# Patient Record
Sex: Female | Born: 1990 | Race: White | Hispanic: No | Marital: Single | Smoking: Never smoker
Health system: Southern US, Community
[De-identification: ages and names within clinical notes are randomized; demographics above are authoritative.]

## PROBLEM LIST (undated history)

## (undated) ENCOUNTER — Emergency Department (HOSPITAL_COMMUNITY): Admission: EM | Payer: Self-pay | Source: Home / Self Care

## (undated) DIAGNOSIS — E669 Obesity, unspecified: Secondary | ICD-10-CM

## (undated) DIAGNOSIS — E119 Type 2 diabetes mellitus without complications: Secondary | ICD-10-CM

## (undated) DIAGNOSIS — I10 Essential (primary) hypertension: Secondary | ICD-10-CM

## (undated) DIAGNOSIS — J302 Other seasonal allergic rhinitis: Secondary | ICD-10-CM

## (undated) DIAGNOSIS — E7800 Pure hypercholesterolemia, unspecified: Secondary | ICD-10-CM

## (undated) DIAGNOSIS — E079 Disorder of thyroid, unspecified: Secondary | ICD-10-CM

## (undated) HISTORY — PX: TONSILLECTOMY: SUR1361

---
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
## 2012-03-14 ENCOUNTER — Emergency Department (HOSPITAL_COMMUNITY)
Admission: EM | Admit: 2012-03-14 | Discharge: 2012-03-14 | Disposition: A | Discharge status: Home / Self Care | Payer: Self-pay | Attending: Emergency Medicine | Admitting: Emergency Medicine

## 2012-03-14 ENCOUNTER — Encounter (HOSPITAL_COMMUNITY): Payer: Self-pay

## 2012-03-14 DIAGNOSIS — I10 Essential (primary) hypertension: Secondary | ICD-10-CM | POA: Insufficient documentation

## 2012-03-14 DIAGNOSIS — E119 Type 2 diabetes mellitus without complications: Secondary | ICD-10-CM | POA: Insufficient documentation

## 2012-03-14 DIAGNOSIS — J3489 Other specified disorders of nose and nasal sinuses: Secondary | ICD-10-CM | POA: Insufficient documentation

## 2012-03-14 DIAGNOSIS — Z880 Allergy status to penicillin: Secondary | ICD-10-CM | POA: Insufficient documentation

## 2012-03-14 DIAGNOSIS — E669 Obesity, unspecified: Secondary | ICD-10-CM | POA: Insufficient documentation

## 2012-03-14 DIAGNOSIS — J349 Unspecified disorder of nose and nasal sinuses: Secondary | ICD-10-CM

## 2012-03-14 HISTORY — DX: Obesity, unspecified: E66.9

## 2012-03-14 HISTORY — DX: Other seasonal allergic rhinitis: J30.2

## 2012-03-14 HISTORY — DX: Essential (primary) hypertension: I10

## 2012-03-14 HISTORY — DX: Pure hypercholesterolemia, unspecified: E78.00

## 2012-03-14 HISTORY — DX: Type 2 diabetes mellitus without complications: E11.9

## 2012-03-14 HISTORY — DX: Disorder of thyroid, unspecified: E07.9

## 2012-03-14 NOTE — ED Provider Notes (Signed)
History     CSN: [id number]  Arrival date & time 03/14/12  1003   First MD Initiated Contact with Patient 03/14/12 1032      Chief Complaint  Patient presents with  . Foreign Body in Nose    (Consider location/radiation/quality/duration/timing/severity/associated sxs/prior treatment) HPI Comments: Pt has noted "something in my R nostril" ~ 2 weeks.  Denies pain but obstructing breathing thru nostril.no fever or chills.  No other complaints.  Patient is a 21 y.o. female presenting with foreign body in nose. The history is provided by the patient. No language interpreter was used.  Foreign Body in Nose This is a new problem. Episode onset: ~ 2 weeks ago. The problem occurs constantly. The problem has been unchanged. Pertinent negatives include no chills or fever. Nothing aggravates the symptoms. She has tried nothing for the symptoms.    Past Medical History  Diagnosis Date  . Hypertension   . Diabetes mellitus without complication   . Obesity   . Seasonal allergies   . Hypercholesteremia   . Thyroid disease     Past Surgical History  Procedure Date  . Tonsillectomy     No family history on file.  History  Substance Use Topics  . Smoking status: Never Smoker   . Smokeless tobacco: Not on file  . Alcohol Use: No    OB History    Grav Para Term Preterm Abortions TAB SAB Ect Mult Living                  Review of Systems  Constitutional: Negative for fever and chills.  HENT: Negative for nosebleeds and rhinorrhea.   Respiratory: Negative for shortness of breath.   All other systems reviewed and are negative.    Allergies  Penicillins  Home Medications   Current Outpatient Rx  Name Route Sig Dispense Refill  . LEVOTHYROXINE SODIUM 100 MCG PO TABS Oral Take 100 mcg by mouth daily.    Marland Kitchen LISINOPRIL 20 MG PO TABS Oral Take 20 mg by mouth daily.    Marland Kitchen LORATADINE 10 MG PO TABS Oral Take 10 mg by mouth daily.    Marland Kitchen METFORMIN HCL 500 MG PO TABS Oral Take 500 mg  by mouth 2 (two) times daily with a meal.    . PRAVASTATIN SODIUM 20 MG PO TABS Oral Take 20 mg by mouth at bedtime.      BP 116/54  Pulse 83  Temp 97.5 F (36.4 C) (Oral)  Resp 20  Ht 5\' 4"  (1.626 m)  Wt 250 lb (113.399 kg)  BMI 42.91 kg/m2  SpO2 100%  LMP 02/18/2012  Physical Exam  Nursing note and vitals reviewed. Constitutional: She is oriented to person, place, and time. She appears well-developed and well-nourished. No distress.  HENT:  Head: Normocephalic and atraumatic.  Nose:  No foreign bodies.       There is obviously something protruding from R nostril.  It appears white and cyst like.  It obstructs free breathing through the nostril.  No pain with palpation.  When i grabbed it gently with tweezers it ruptured with flow of clear fluid that was slightly blood-tinged.  Dr. Preston Fleeting also evaluated her and described what he saw as granulation tissue that appears pedunculated.    Eyes: EOM are normal.  Neck: Normal range of motion.  Cardiovascular: Normal rate, regular rhythm and normal heart sounds.   Pulmonary/Chest: Effort normal and breath sounds normal.  Abdominal: Soft. She exhibits no distension. There is no tenderness.  Musculoskeletal: Normal range of motion.  Neurological: She is alert and oriented to person, place, and time.  Skin: Skin is warm and dry.  Psychiatric: She has a normal mood and affect. Judgment normal.    ED Course  Procedures (including critical care time)  Labs Reviewed - No data to display No results found. 1200-spoke with dr. Suszanne Conners.  He is in [place] today and asks that i send pt to the office at 1300.  1. Nose disorder       MDM  f/u with dr. Suszanne Conners in his office at 1:00 today.        Evalina Field, [place] 03/14/12 1209

## 2012-03-14 NOTE — ED Notes (Signed)
Patient with no complaints at this time. Respirations even and unlabored. Skin warm/dry. Discharge instructions reviewed with patient at this time. Patient given opportunity to voice concerns/ask questions. Patient discharged at this time and left Emergency Department with steady gait.   

## 2012-03-14 NOTE — ED Provider Notes (Signed)
21 year old female has noticed a growth in her right nostril over last 2 weeks. She states that it has not changed significantly from when she first noticed it. There is no pain, but it obstructs her breathing. On exam, there is a collection of granulation tissue which appears pedunculated. Apparently, there was a cyst present which had been ruptured by the physician assistant who evaluated her initially a. She will need referral to ENT for excision and biopsy.  Medical screening examination/treatment/procedure(s) were conducted as a shared visit with non-physician practitioner(s) and myself.  I personally evaluated the patient during the encounter   Dione Booze, MD 03/14/12 1121

## 2012-03-14 NOTE — ED Notes (Signed)
Foreign body stuck in her right nare for 2 weeks, went to health department this am and was sent to ed.

## 2013-08-22 ENCOUNTER — Emergency Department (HOSPITAL_COMMUNITY)
Admission: EM | Admit: 2013-08-22 | Discharge: 2013-08-22 | Disposition: A | Discharge status: Home / Self Care | Payer: Self-pay | Attending: Emergency Medicine | Admitting: Emergency Medicine

## 2013-08-22 ENCOUNTER — Encounter (HOSPITAL_COMMUNITY): Payer: Self-pay | Admitting: Emergency Medicine

## 2013-08-22 DIAGNOSIS — E119 Type 2 diabetes mellitus without complications: Secondary | ICD-10-CM | POA: Insufficient documentation

## 2013-08-22 DIAGNOSIS — I10 Essential (primary) hypertension: Secondary | ICD-10-CM | POA: Insufficient documentation

## 2013-08-22 DIAGNOSIS — J029 Acute pharyngitis, unspecified: Secondary | ICD-10-CM | POA: Insufficient documentation

## 2013-08-22 DIAGNOSIS — Z79899 Other long term (current) drug therapy: Secondary | ICD-10-CM | POA: Insufficient documentation

## 2013-08-22 DIAGNOSIS — H6691 Otitis media, unspecified, right ear: Secondary | ICD-10-CM

## 2013-08-22 DIAGNOSIS — R05 Cough: Secondary | ICD-10-CM

## 2013-08-22 DIAGNOSIS — Z880 Allergy status to penicillin: Secondary | ICD-10-CM | POA: Insufficient documentation

## 2013-08-22 DIAGNOSIS — E079 Disorder of thyroid, unspecified: Secondary | ICD-10-CM | POA: Insufficient documentation

## 2013-08-22 DIAGNOSIS — E669 Obesity, unspecified: Secondary | ICD-10-CM | POA: Insufficient documentation

## 2013-08-22 DIAGNOSIS — E7800 Pure hypercholesterolemia, unspecified: Secondary | ICD-10-CM | POA: Insufficient documentation

## 2013-08-22 DIAGNOSIS — H6690 Otitis media, unspecified, unspecified ear: Secondary | ICD-10-CM | POA: Insufficient documentation

## 2013-08-22 DIAGNOSIS — R059 Cough, unspecified: Secondary | ICD-10-CM

## 2013-08-22 MED ORDER — AZITHROMYCIN 250 MG PO TABS: ORAL_TABLET | ORAL | Status: DC

## 2013-08-22 MED ORDER — ANTIPYRINE-BENZOCAINE 5.4-1.4 % OT SOLN
3.0000 [drp] | Freq: Once | OTIC | Status: AC
  Administered 2013-08-22: 4 [drp] via OTIC
  Filled 2013-08-22: qty 10

## 2013-08-22 MED ORDER — GUAIFENESIN-CODEINE 100-10 MG/5ML PO SYRP: 10.0000 mL | ORAL_SOLUTION | Freq: Three times a day (TID) | ORAL | Status: DC | PRN

## 2013-08-22 NOTE — ED Notes (Signed)
Rt ear pain, and sore throat since yesterday, Alert, NAD

## 2013-08-22 NOTE — ED Provider Notes (Signed)
History/physical exam/procedure(s) were performed by non-physician practitioner and as supervising physician I was immediately available for consultation/collaboration. I have reviewed all notes and am in agreement with care and plan.'   Josip Merolla S Leomar Westberg, MD 08/22/13 2339 

## 2013-08-22 NOTE — Discharge Instructions (Signed)
Cough, Adult ° A cough is a reflex. It helps you clear your throat and airways. A cough can help heal your body. A cough can last 2 or 3 weeks (acute) or may last more than 8 weeks (chronic). Some common causes of a cough can include an infection, allergy, or a cold. °HOME CARE °· Only take medicine as told by your doctor. °· If given, take your medicines (antibiotics) as told. Finish them even if you start to feel better. °· Use a cold steam vaporizer or humidier in your home. This can help loosen thick spit (secretions). °· Sleep so you are almost sitting up (semi-upright). Use pillows to do this. This helps reduce coughing. °· Rest as needed. °· Stop smoking if you smoke. °GET HELP RIGHT AWAY IF: °· You have yellowish-white fluid (pus) in your thick spit. °· Your cough gets worse. °· Your medicine does not reduce coughing, and you are losing sleep. °· You cough up blood. °· You have trouble breathing. °· Your pain gets worse and medicine does not help. °· You have a fever. °MAKE SURE YOU:  °· Understand these instructions. °· Will watch your condition. °· Will get help right away if you are not doing well or get worse. °Document Released: 02/02/2011 Document Revised: 08/14/2011 Document Reviewed: 02/02/2011 °ExitCare® Patient Information ©2014 [employer]. ° °Otitis Media, Adult °Otitis media is redness, soreness, and puffiness (swelling) in the space just behind your eardrum (middle ear). It may be caused by allergies or infection. It often happens along with a cold. °HOME CARE °· Take your medicine as told. Finish it even if you start to feel better. °· Only take over-the-counter or prescription medicines for pain, discomfort, or fever as told by your doctor. °· Follow up with your doctor as told. °GET HELP IF: °· You have otitis media only in one ear or bleeding from your nose or both. °· You notice a lump on your neck. °· You are not getting better in 3 5 days. °· You feel worse instead of better. °GET HELP  RIGHT AWAY IF:  °· You have pain that is not helped with medicine. °· You have puffiness, redness, or pain around your ear. °· You get a stiff neck. °· You cannot move part of your face (paralysis). °· You notice that the bone behind your ear hurts when you touch it. °MAKE SURE YOU:  °· Understand these instructions. °· Will watch your condition. °· Will get help right away if you are not doing well or get worse. °Document Released: 11/08/2007 Document Revised: 01/22/2013 Document Reviewed: 12/17/2012 °ExitCare® Patient Information ©2014 [employer]. ° °

## 2013-08-22 NOTE — ED Notes (Signed)
Bilateral ear pain and sore throat

## 2013-08-22 NOTE — ED Provider Notes (Signed)
CSN: [id number]     Arrival date & time 08/22/13  1549 History   First MD Initiated Contact with Patient 08/22/13 1623     Chief Complaint  Patient presents with  . Sore Throat     (Consider location/radiation/quality/duration/timing/severity/associated sxs/prior Treatment) Patient is a 23 y.o. female presenting with pharyngitis. The history is provided by the patient.  Sore Throat This is a new problem. The current episode started in the past 7 days. The problem occurs constantly. The problem has been unchanged. Associated symptoms include congestion, coughing, myalgias, a sore throat and swollen glands. Pertinent negatives include no abdominal pain, arthralgias, chest pain, chills, fever, headaches, nausea, neck pain, numbness, rash, urinary symptoms, vomiting or weakness. The symptoms are aggravated by swallowing. Treatments tried: claritin. The treatment provided no relief.    Past Medical History  Diagnosis Date  . Hypertension   . Diabetes mellitus without complication   . Obesity   . Seasonal allergies   . Hypercholesteremia   . Thyroid disease    Past Surgical History  Procedure Laterality Date  . Tonsillectomy     History reviewed. No pertinent family history. History  Substance Use Topics  . Smoking status: Never Smoker   . Smokeless tobacco: Not on file  . Alcohol Use: No   OB History   Grav Para Term Preterm Abortions TAB SAB Ect Mult Living                 Review of Systems  Constitutional: Negative for fever, chills, activity change and appetite change.  HENT: Positive for congestion, ear pain, postnasal drip, rhinorrhea, sinus pressure and sore throat. Negative for facial swelling and trouble swallowing.   Eyes: Negative for visual disturbance.  Respiratory: Positive for cough. Negative for chest tightness, shortness of breath, wheezing and stridor.   Cardiovascular: Negative for chest pain.  Gastrointestinal: Negative for nausea, vomiting and abdominal  pain.  Genitourinary: Negative for dysuria.  Musculoskeletal: Positive for myalgias. Negative for arthralgias, neck pain and neck stiffness.  Skin: Negative.  Negative for rash.  Neurological: Negative for dizziness, syncope, weakness, numbness and headaches.  Hematological: Negative for adenopathy.  Psychiatric/Behavioral: Negative for confusion.  All other systems reviewed and are negative.      Allergies  Penicillins  Home Medications   Current Outpatient Rx  Name  Route  Sig  Dispense  Refill  . levothyroxine (SYNTHROID, LEVOTHROID) 100 MCG tablet   Oral   Take 100 mcg by mouth daily.         Marland Kitchen. lisinopril (PRINIVIL,ZESTRIL) 20 MG tablet   Oral   Take 20 mg by mouth daily.         Marland Kitchen. loratadine (CLARITIN) 10 MG tablet   Oral   Take 10 mg by mouth daily.         . metFORMIN (GLUCOPHAGE) 500 MG tablet   Oral   Take 500 mg by mouth 2 (two) times daily with a meal.         . pravastatin (PRAVACHOL) 20 MG tablet   Oral   Take 20 mg by mouth at bedtime.          BP 127/66  Pulse 78  Temp(Src) 98.4 F (36.9 C)  Resp 18  Ht 5\' 4"  (1.626 m)  Wt 250 lb (113.399 kg)  BMI 42.89 kg/m2  SpO2 96%  LMP 08/04/2013 Physical Exam  Nursing note and vitals reviewed. Constitutional: She is oriented to person, place, and time. She appears well-developed and well-nourished. No  distress.  HENT:  Head: Normocephalic and atraumatic.  Right Ear: Ear canal normal. There is tenderness. No drainage or swelling. No mastoid tenderness. Tympanic membrane is erythematous. Tympanic membrane is not bulging. No middle ear effusion. No hemotympanum.  Left Ear: Tympanic membrane and ear canal normal.  No middle ear effusion.  Nose: Mucosal edema and rhinorrhea present.  Mouth/Throat: Uvula is midline and mucous membranes are normal. No trismus in the jaw. No uvula swelling. Posterior oropharyngeal erythema present. No oropharyngeal exudate, posterior oropharyngeal edema or tonsillar  abscesses.  Eyes: Conjunctivae are normal.  Neck: Normal range of motion and phonation normal. Neck supple. No Brudzinski's sign and no Kernig's sign noted.  Cardiovascular: Normal rate, regular rhythm, normal heart sounds and intact distal pulses.   No murmur heard. Pulmonary/Chest: Effort normal and breath sounds normal. No respiratory distress. She has no wheezes. She has no rales. She exhibits no tenderness.  Abdominal: Soft. She exhibits no distension. There is no tenderness. There is no rebound and no guarding.  Musculoskeletal: Normal range of motion. She exhibits no edema.  Lymphadenopathy:    She has no cervical adenopathy.  Neurological: She is alert and oriented to person, place, and time. She exhibits normal muscle tone. Coordination normal.  Skin: Skin is warm and dry.    ED Course  Procedures (including critical care time) Labs Review Labs Reviewed - No data to display Imaging Review No results found.   EKG Interpretation None      MDM   Final diagnoses:  Otitis media of right ear  Cough    VSS.  Patient is well appearing.  Nasal congestion and cough with right OM.  Pt agrees to ibuprofen, fluids, zithromax and auralgan drops.      Pt agrees to f/u wit her PMD for recheck if needed and appears stable for discharge.   Tarahji Ramthun L. Rondey Fallen, PA-C 08/22/13 2308

## 2014-07-19 ENCOUNTER — Emergency Department (HOSPITAL_COMMUNITY)
Admission: EM | Admit: 2014-07-19 | Discharge: 2014-07-19 | Disposition: A | Discharge status: Home / Self Care | Payer: Self-pay | Attending: Emergency Medicine | Admitting: Emergency Medicine

## 2014-07-19 ENCOUNTER — Encounter (HOSPITAL_COMMUNITY): Payer: Self-pay | Admitting: Emergency Medicine

## 2014-07-19 DIAGNOSIS — J020 Streptococcal pharyngitis: Secondary | ICD-10-CM | POA: Insufficient documentation

## 2014-07-19 DIAGNOSIS — Z880 Allergy status to penicillin: Secondary | ICD-10-CM | POA: Insufficient documentation

## 2014-07-19 DIAGNOSIS — E079 Disorder of thyroid, unspecified: Secondary | ICD-10-CM | POA: Insufficient documentation

## 2014-07-19 DIAGNOSIS — E119 Type 2 diabetes mellitus without complications: Secondary | ICD-10-CM | POA: Insufficient documentation

## 2014-07-19 DIAGNOSIS — E669 Obesity, unspecified: Secondary | ICD-10-CM | POA: Insufficient documentation

## 2014-07-19 DIAGNOSIS — I10 Essential (primary) hypertension: Secondary | ICD-10-CM | POA: Insufficient documentation

## 2014-07-19 DIAGNOSIS — Z79899 Other long term (current) drug therapy: Secondary | ICD-10-CM | POA: Insufficient documentation

## 2014-07-19 LAB — RAPID STREP SCREEN (MED CTR MEBANE ONLY): STREPTOCOCCUS, GROUP A SCREEN (DIRECT): POSITIVE — AB

## 2014-07-19 MED ORDER — AZITHROMYCIN 250 MG PO TABS
ORAL_TABLET | ORAL | Status: DC
Start: 2014-07-19 — End: 2014-09-05

## 2014-07-19 NOTE — ED Provider Notes (Signed)
CSN: [id number]     Arrival date & time 07/19/14  1148 History  This chart was scribed for [facility]-physician practitioner [facility]. Damian LeavellNeese, NP, working with Donnetta HutchingBrian Cook, MD, by Roxy Cedarhandni Bhalodia ED Scribe. This patient was seen in room APFT22/APFT22 and the patient's care was started at 12:02 PM   Chief Complaint  Patient presents with  . Nasal Congestion   Patient is a 24 y.o. female presenting with cough. The history is provided by the patient. No language interpreter was used.  Cough Cough characteristics:  Non-productive Severity:  Moderate Onset quality:  Gradual Duration:  1 day Timing:  Constant Progression:  Waxing and waning Chronicity:  New Relieved by:  Nothing Worsened by:  Nothing tried Ineffective treatments:  None tried Associated symptoms: rhinorrhea, sinus congestion and sore throat   Associated symptoms: no chills, no fever and no wheezing    HPI Comments: Erica Elliott is a 24 y.o. female who presents to the Emergency Department complaining of moderate nasal congestion, sore throat, and nonproductive cough onset yesterday. She also reports associated intermittent nausea. She denies associated wheezing, abdominal pain, fever, chills, or otalgia. She states that she has taken tylenol and robitussin since yesterday with no relief.  Past Medical History  Diagnosis Date  . Hypertension   . Diabetes mellitus without complication   . Obesity   . Seasonal allergies   . Hypercholesteremia   . Thyroid disease    Past Surgical History  Procedure Laterality Date  . Tonsillectomy     History reviewed. No pertinent family history. History  Substance Use Topics  . Smoking status: Never Smoker   . Smokeless tobacco: Not on file  . Alcohol Use: No   OB History    No data available     Review of Systems  Constitutional: Negative for fever and chills.  HENT: Positive for congestion, rhinorrhea and sore throat.   Respiratory: Positive for cough. Negative for wheezing.    All other systems reviewed and are negative.  Allergies  Penicillins  Home Medications   Prior to Admission medications   Medication Sig Start Date End Date Taking? Authorizing Provider  acetaminophen (TYLENOL) 500 MG tablet Take 1,000 mg by mouth every 4 (four) hours as needed for headache.   Yes Historical Provider, MD  Chlorpheniramine Maleate (ALLERGY PO) Take 2 tablets by mouth every 4 (four) hours as needed (allergies).   Yes Historical Provider, MD  guaiFENesin-codeine (ROBITUSSIN AC) 100-10 MG/5ML syrup Take 10 mLs by mouth 3 (three) times daily as needed for cough. 08/22/13  Yes Tammy L. Triplett, PA-C  levothyroxine (SYNTHROID, LEVOTHROID) 100 MCG tablet Take 100 mcg by mouth daily.   Yes Historical Provider, MD  lisinopril (PRINIVIL,ZESTRIL) 20 MG tablet Take 20 mg by mouth daily.   Yes Historical Provider, MD  loratadine (CLARITIN) 10 MG tablet Take 10 mg by mouth daily.   Yes Historical Provider, MD  metFORMIN (GLUCOPHAGE) 500 MG tablet Take 500 mg by mouth 2 (two) times daily with a meal.   Yes Historical Provider, MD  PRESCRIPTION MEDICATION Take 1 tablet by mouth daily. Birth control.   Yes Historical Provider, MD  azithromycin (ZITHROMAX) 250 MG tablet Take 2 tablets PO today and then one tablet daily. 07/19/14   Hope Orlene OchM Neese, NP   Triage Vitals: BP 111/72 mmHg  Pulse 100  Temp(Src) 98.4 F (36.9 C) (Oral)  Resp 24  Ht 5\' 4"  (1.626 m)  Wt 250 lb (113.399 kg)  BMI 42.89 kg/m2  SpO2 100%  LMP 07/07/2014  Physical Exam  Constitutional: She is oriented to person, place, and time. She appears well-developed and well-nourished.  Non-toxic appearance. She does not appear ill. No distress.  HENT:  Head: Normocephalic and atraumatic.  Right Ear: External ear normal.  Left Ear: External ear normal.  Nose: Mucosal edema and rhinorrhea present.  Mouth/Throat: Uvula is midline and mucous membranes are normal. No dental abscesses or uvula swelling. Posterior oropharyngeal  erythema present.  Eyes: Conjunctivae and EOM are normal. Pupils are equal, round, and reactive to light.  Neck: Normal range of motion and full passive range of motion without pain. Neck supple.  Cardiovascular: Normal rate, regular rhythm and normal heart sounds.  Exam reveals no gallop and no friction rub.   No murmur heard. Pulmonary/Chest: Effort normal. No respiratory distress. She has no wheezes. She has no rhonchi. She has no rales. She exhibits no crepitus.  Musculoskeletal: Normal range of motion.  Lymphadenopathy:    She has cervical adenopathy.  Neurological: She is alert and oriented to person, place, and time. She has normal strength. No cranial nerve deficit.  Skin: Skin is warm, dry and intact. No rash noted. No erythema. No pallor.  Psychiatric: She has a normal mood and affect. Her speech is normal and behavior is normal.  Nursing note and vitals reviewed.  ED Course  Procedures (including critical care time)  DIAGNOSTIC STUDIES: Oxygen Saturation is 100% on RA, normal by my interpretation.    COORDINATION OF CARE: 12:08 PM- Discussed plans to order diagnostic strep test. Pt advised of plan for treatment and pt agrees. Results for orders placed or performed during the hospital encounter of 07/19/14 (from the past 24 hour(s))  Rapid strep screen     Status: Abnormal   Collection Time: 07/19/14 12:04 PM  Result Value Ref Range   Streptococcus, Group A Screen (Direct) POSITIVE (A) NEGATIVE    MDM  24 y.o. female with sore throat, congestion and dry cough that started yesterday. Stable for discharge without difficulty swallowing. No respiratory distress. O2 SAT 100% on R/A. Will treat for positive strep with Zithromax since she is allergic to penicillin. She will return for worsening symptoms.  Final diagnoses:  Strep pharyngitis   I personally performed the services described in this documentation, which was scribed in my presence. The recorded information has been  reviewed and is accurate.   [place], [place] 07/19/14 1242  Donnetta Hutching, MD 07/21/14 2107

## 2014-07-19 NOTE — ED Notes (Signed)
Pt reports nasal congestion, sore throat and cough since yesterday. Pt denies any fevers. nad noted.

## 2014-07-19 NOTE — Discharge Instructions (Signed)
YOur strep test today is positive. Take the antibiotics as directed. Use Chloraseptic spray for your throat, continue to take tylenol and ibuprofen for pain and fever. You may continue to take medication for congestion and cough as needed. Return for worsening symptoms.

## 2014-09-05 ENCOUNTER — Encounter (HOSPITAL_COMMUNITY): Payer: Self-pay

## 2014-09-05 ENCOUNTER — Emergency Department (HOSPITAL_COMMUNITY)
Admission: EM | Admit: 2014-09-05 | Discharge: 2014-09-05 | Disposition: A | Discharge status: Home / Self Care | Payer: Self-pay | Attending: Emergency Medicine | Admitting: Emergency Medicine

## 2014-09-05 DIAGNOSIS — M545 Low back pain, unspecified: Secondary | ICD-10-CM

## 2014-09-05 DIAGNOSIS — Z79899 Other long term (current) drug therapy: Secondary | ICD-10-CM | POA: Insufficient documentation

## 2014-09-05 DIAGNOSIS — E669 Obesity, unspecified: Secondary | ICD-10-CM | POA: Insufficient documentation

## 2014-09-05 DIAGNOSIS — E119 Type 2 diabetes mellitus without complications: Secondary | ICD-10-CM | POA: Insufficient documentation

## 2014-09-05 DIAGNOSIS — E079 Disorder of thyroid, unspecified: Secondary | ICD-10-CM | POA: Insufficient documentation

## 2014-09-05 DIAGNOSIS — Z880 Allergy status to penicillin: Secondary | ICD-10-CM | POA: Insufficient documentation

## 2014-09-05 DIAGNOSIS — I10 Essential (primary) hypertension: Secondary | ICD-10-CM | POA: Insufficient documentation

## 2014-09-05 DIAGNOSIS — Z3202 Encounter for pregnancy test, result negative: Secondary | ICD-10-CM | POA: Insufficient documentation

## 2014-09-05 LAB — URINE MICROSCOPIC-ADD ON

## 2014-09-05 LAB — URINALYSIS, ROUTINE W REFLEX MICROSCOPIC
Glucose, UA: NEGATIVE mg/dL
HGB URINE DIPSTICK: NEGATIVE
Nitrite: NEGATIVE
Protein, ur: NEGATIVE mg/dL
Specific Gravity, Urine: >1.03 — ABNORMAL HIGH (ref 1.005–1.030)
Urobilinogen, UA: 1 mg/dL (ref 0.0–1.0)
pH: 5.5 (ref 5.0–8.0)

## 2014-09-05 LAB — POC URINE PREG, ED: PREG TEST UR: NEGATIVE

## 2014-09-05 MED ORDER — IBUPROFEN 600 MG PO TABS: 600.0000 mg | ORAL_TABLET | Freq: Four times a day (QID) | ORAL | Status: DC | PRN

## 2014-09-05 MED ORDER — IBUPROFEN 800 MG PO TABS
ORAL_TABLET | ORAL | Status: AC
  Filled 2014-09-05: qty 1

## 2014-09-05 MED ORDER — IBUPROFEN 800 MG PO TABS
800.0000 mg | ORAL_TABLET | Freq: Once | ORAL | Status: AC
  Administered 2014-09-05: 800 mg via ORAL

## 2014-09-05 NOTE — Discharge Instructions (Signed)
SEEK IMMEDIATE MEDICAL ATTENTION IF: °New numbness, tingling, weakness, or problem with the use of your arms or legs.  °Severe back pain not relieved with medications.  °Change in bowel or bladder control (if you lose control of stool or urine, or if you are unable to urinate) °Increasing pain in any areas of the body (such as chest or abdominal pain).  °Shortness of breath, dizziness or fainting.  °Nausea (feeling sick to your stomach), vomiting, fever, or sweats. ° ° °Back Exercises °Back exercises help treat and prevent back injuries. The goal of back exercises is to increase the strength of your abdominal and back muscles and the flexibility of your back. These exercises should be started when you no longer have back pain. Back exercises include: °· Pelvic Tilt. Lie on your back with your knees bent. Tilt your pelvis until the lower part of your back is against the floor. Hold this position 5 to 10 sec and repeat 5 to 10 times. °· Knee to Chest. Pull first 1 knee up against your chest and hold for 20 to 30 seconds, repeat this with the other knee, and then both knees. This may be done with the other leg straight or bent, whichever feels better. °· Sit-Ups or Curl-Ups. Bend your knees 90 degrees. Start with tilting your pelvis, and do a partial, slow sit-up, lifting your trunk only 30 to 45 degrees off the floor. Take at least 2 to 3 seconds for each sit-up. Do not do sit-ups with your knees out straight. If partial sit-ups are difficult, simply do the above but with only tightening your abdominal muscles and holding it as directed. °· Hip-Lift. Lie on your back with your knees flexed 90 degrees. Push down with your feet and shoulders as you raise your hips a couple inches off the floor; hold for 10 seconds, repeat 5 to 10 times. °· Back arches. Lie on your stomach, propping yourself up on bent elbows. Slowly press on your hands, causing an arch in your low back. Repeat 3 to 5 times. Any initial stiffness and  discomfort should lessen with repetition over time. °· Shoulder-Lifts. Lie face down with arms beside your body. Keep hips and torso pressed to floor as you slowly lift your head and shoulders off the floor. °Do not overdo your exercises, especially in the beginning. Exercises may cause you some mild back discomfort which lasts for a few minutes; however, if the pain is more severe, or lasts for more than 15 minutes, do not continue exercises until you see your caregiver. Improvement with exercise therapy for back problems is slow.  °See your caregivers for assistance with developing a proper back exercise program. °Document Released: 06/29/2004 Document Revised: 08/14/2011 Document Reviewed: 03/23/2011 °ExitCare® Patient Information ©2015 [employer]. This information is not intended to replace advice given to you by your health care provider. Make sure you discuss any questions you have with your health care provider. ° °

## 2014-09-05 NOTE — ED Provider Notes (Signed)
CSN: [id number]     Arrival date & time 09/05/14  0507 History   First MD Initiated Contact with Patient 09/05/14 [phone]     Chief Complaint  Patient presents with  . Back Pain    Patient is a 24 y.o. female presenting with back pain. The history is provided by the patient.  Back Pain Location:  Lumbar spine Quality: soreness. Radiates to:  Does not radiate Pain severity:  Mild Onset quality:  Sudden Duration:  2 hours Timing:  Constant Progression:  Unchanged Chronicity:  New Relieved by:  Nothing Worsened by:  Movement and palpation Associated symptoms: no abdominal pain, no bladder incontinence, no bowel incontinence, no dysuria, no fever and no weakness   denies trauma She woke up with pain No new weakness reported  Past Medical History  Diagnosis Date  . Hypertension   . Diabetes mellitus without complication   . Obesity   . Seasonal allergies   . Hypercholesteremia   . Thyroid disease    Past Surgical History  Procedure Laterality Date  . Tonsillectomy     No family history on file. History  Substance Use Topics  . Smoking status: Never Smoker   . Smokeless tobacco: Not on file  . Alcohol Use: No   OB History    No data available     Review of Systems  Constitutional: Negative for fever.  Gastrointestinal: Negative for abdominal pain and bowel incontinence.  Genitourinary: Negative for bladder incontinence and dysuria.  Musculoskeletal: Positive for back pain.  Neurological: Negative for weakness.      Allergies  Penicillins  Home Medications   Prior to Admission medications   Medication Sig Start Date End Date Taking? Authorizing Provider  Chlorpheniramine Maleate (ALLERGY PO) Take 2 tablets by mouth every 4 (four) hours as needed (allergies).   Yes Historical Provider, MD  ibuprofen (ADVIL,MOTRIN) 400 MG tablet Take 400 mg by mouth every 6 (six) hours as needed.   Yes Historical Provider, MD  levothyroxine (SYNTHROID, LEVOTHROID) 100 MCG tablet  Take 100 mcg by mouth daily.   Yes Historical Provider, MD  lisinopril (PRINIVIL,ZESTRIL) 20 MG tablet Take 20 mg by mouth daily.   Yes Historical Provider, MD  loratadine (CLARITIN) 10 MG tablet Take 10 mg by mouth daily.   Yes Historical Provider, MD  metFORMIN (GLUCOPHAGE) 500 MG tablet Take 500 mg by mouth 2 (two) times daily with a meal.   Yes Historical Provider, MD   BP 134/88 mmHg  Pulse 77  Temp(Src) 97.6 F (36.4 C) (Oral)  Resp 22  Ht 5\' 4"  (1.626 m)  Wt 250 lb (113.399 kg)  BMI 42.89 kg/m2  SpO2 100%  LMP 08/18/2014 (Approximate) Physical Exam CONSTITUTIONAL: Well developed/well nourished, pt using phone and in no distress HEAD: Normocephalic/atraumatic EYES: EOMI ENMT: Mucous membranes moist NECK: supple no meningeal signs SPINE/BACK:mild lumbar spinal tenderness.  No bruising/crepitance/stepoffs noted to spine CV: S1/S2 noted, no murmurs/rubs/gallops noted LUNGS: Lungs are clear to auscultation bilaterally, no apparent distress ABDOMEN: soft, nontender, no rebound or guarding GU:no cva tenderness NEURO: Awake/alert,  equal motor 5/5 strength noted with the following: hip flexion/knee flexion/extension, foot dorsi/plantar flexion,  no sensory deficit in any dermatome.  Equal patellar/achilles reflex noted (2+) in bilateral lower extremities.  Pt is able to ambulate unassisted. EXTREMITIES: pulses normal, full ROM SKIN: warm, color normal PSYCH: no abnormalities of mood noted, alert and oriented to situation   ED Course  Procedures   No convincing signs of UTI Pt is well  appearing, no neuro deficits and ambulatory Will treat with anti-inflammatories  Labs Review Labs Reviewed  URINALYSIS, ROUTINE W REFLEX MICROSCOPIC - Abnormal; Notable for the following:    APPearance CLOUDY (*)    Specific Gravity, Urine >1.030 (*)    Bilirubin Urine SMALL (*)    Ketones, ur TRACE (*)    Leukocytes, UA TRACE (*)    All other components within normal limits  URINE  MICROSCOPIC-ADD ON - Abnormal; Notable for the following:    Squamous Epithelial / LPF MANY (*)    Bacteria, UA MANY (*)    Casts GRANULAR CAST (*)    Crystals CA OXALATE CRYSTALS (*)    All other components within normal limits  POC URINE PREG, ED      Medications  ibuprofen (ADVIL,MOTRIN) tablet 800 mg (800 mg Oral Given 09/05/14 0518)  ibuprofen (ADVIL,MOTRIN) 800 MG tablet (  Duplicate 09/05/14 0529)    MDM   Final diagnoses:  Midline low back pain without sciatica    Nursing notes including past medical history and social history reviewed and considered in documentation Labs/vital reviewed myself and considered during evaluation     Zadie Rhine, MD 09/05/14 [phone]

## 2014-09-05 NOTE — ED Notes (Signed)
Awoke with lower back soreness

## 2018-01-18 ENCOUNTER — Other Ambulatory Visit: Payer: Self-pay

## 2018-01-18 ENCOUNTER — Emergency Department (HOSPITAL_COMMUNITY)
Admission: EM | Admit: 2018-01-18 | Discharge: 2018-01-18 | Disposition: A | Discharge status: Home / Self Care | Payer: Self-pay | Attending: Emergency Medicine | Admitting: Emergency Medicine

## 2018-01-18 ENCOUNTER — Encounter (HOSPITAL_COMMUNITY): Payer: Self-pay | Admitting: Emergency Medicine

## 2018-01-18 DIAGNOSIS — E119 Type 2 diabetes mellitus without complications: Secondary | ICD-10-CM | POA: Insufficient documentation

## 2018-01-18 DIAGNOSIS — Z79899 Other long term (current) drug therapy: Secondary | ICD-10-CM | POA: Insufficient documentation

## 2018-01-18 DIAGNOSIS — E7800 Pure hypercholesterolemia, unspecified: Secondary | ICD-10-CM | POA: Insufficient documentation

## 2018-01-18 DIAGNOSIS — I10 Essential (primary) hypertension: Secondary | ICD-10-CM | POA: Insufficient documentation

## 2018-01-18 DIAGNOSIS — J029 Acute pharyngitis, unspecified: Secondary | ICD-10-CM | POA: Insufficient documentation

## 2018-01-18 MED ORDER — IBUPROFEN 800 MG PO TABS
800.0000 mg | ORAL_TABLET | Freq: Once | ORAL | Status: AC
  Administered 2018-01-18: 800 mg via ORAL
  Filled 2018-01-18: qty 1

## 2018-01-18 MED ORDER — CLINDAMYCIN HCL 150 MG PO CAPS
300.0000 mg | ORAL_CAPSULE | Freq: Once | ORAL | Status: AC
  Administered 2018-01-18: 300 mg via ORAL
  Filled 2018-01-18: qty 2

## 2018-01-18 MED ORDER — CLINDAMYCIN HCL 150 MG PO CAPS: 150.0000 mg | ORAL_CAPSULE | Freq: Four times a day (QID) | ORAL | 0 refills | Status: DC

## 2018-01-18 MED ORDER — ONDANSETRON HCL 4 MG PO TABS
4.0000 mg | ORAL_TABLET | Freq: Once | ORAL | Status: AC
  Administered 2018-01-18: 4 mg via ORAL
  Filled 2018-01-18: qty 1

## 2018-01-18 MED ORDER — IBUPROFEN 600 MG PO TABS: 600.0000 mg | ORAL_TABLET | Freq: Four times a day (QID) | ORAL | 0 refills | Status: AC

## 2018-01-18 NOTE — Discharge Instructions (Addendum)
Please wash your hands frequently.  Please use her mask until symptoms have resolved.  Please use clindamycin and ibuprofen with breakfast, lunch, dinner, and at bedtime.  Please use salt water gargles and Chloraseptic spray to help with your sore throat pain.  Please increase your fluids and maintain good hydration.

## 2018-01-18 NOTE — ED Provider Notes (Signed)
[facility] EMERGENCY DEPARTMENT Provider Note   CSN: [id number] Arrival date & time: 01/18/18  1522     History   Chief Complaint Chief Complaint  Patient presents with  . Sore Throat    HPI Erica Elliott is a 27 y.o. female.  The history is provided by the patient.  Sore Throat  This is a new problem. The current episode started yesterday. The problem occurs constantly. The problem has been gradually worsening. Pertinent negatives include no chest pain, no abdominal pain, no headaches and no shortness of breath. The symptoms are aggravated by swallowing. Nothing relieves the symptoms. She has tried nothing for the symptoms.    Past Medical History:  Diagnosis Date  . Diabetes mellitus without complication (HCC)   . Hypercholesteremia   . Hypertension   . Obesity   . Seasonal allergies   . Thyroid disease     There are no active problems to display for this patient.   Past Surgical History:  Procedure Laterality Date  . TONSILLECTOMY       OB History   None      Home Medications    Prior to Admission medications   Medication Sig Start Date End Date Taking? Authorizing Provider  Chlorpheniramine Maleate (ALLERGY PO) Take 2 tablets by mouth every 4 (four) hours as needed (allergies).    [provider]  ibuprofen (ADVIL,MOTRIN) 600 MG tablet Take 1 tablet (600 mg total) by mouth every 6 (six) hours as needed. 09/05/14   Zadie RhineWickline, Donald, MD  levothyroxine (SYNTHROID, LEVOTHROID) 100 MCG tablet Take 100 mcg by mouth daily.    [provider]  lisinopril (PRINIVIL,ZESTRIL) 20 MG tablet Take 20 mg by mouth daily.    [provider]  loratadine (CLARITIN) 10 MG tablet Take 10 mg by mouth daily.    [provider]  metFORMIN (GLUCOPHAGE) 500 MG tablet Take 500 mg by mouth 2 (two) times daily with a meal.    [provider]    Family History No family history on file.  Social History Social History   Tobacco  Use  . Smoking status: Never Smoker  . Smokeless tobacco: Never Used  Substance Use Topics  . Alcohol use: No  . Drug use: No     Allergies   Penicillins   Review of Systems Review of Systems  Constitutional: Negative for activity change.       All ROS Neg except as noted in HPI  HENT: Positive for congestion and sore throat. Negative for nosebleeds.   Eyes: Negative for photophobia and discharge.  Respiratory: Negative for cough, shortness of breath and wheezing.   Cardiovascular: Negative for chest pain and palpitations.  Gastrointestinal: Negative for abdominal pain and blood in stool.  Genitourinary: Negative for dysuria, frequency and hematuria.  Musculoskeletal: Negative for arthralgias, back pain and neck pain.  Skin: Negative.   Neurological: Negative for dizziness, seizures, speech difficulty and headaches.  Psychiatric/Behavioral: Negative for confusion and hallucinations.     Physical Exam Updated Vital Signs BP (!) 161/90 (BP Location: Right Wrist)   Pulse 89   Temp 98.2 F (36.8 C) (Oral)   Resp 18   Ht 5\' 4"  (1.626 m)   Wt 113.4 kg   LMP 01/04/2018   SpO2 98%   BMI 42.91 kg/m   Physical Exam  Constitutional: She is oriented to person, place, and time. She appears well-developed and well-nourished.  Non-toxic appearance.  HENT:  Head: Normocephalic.  Right Ear: Tympanic membrane and  external ear normal.  Left Ear: Tympanic membrane and external ear normal.  Mouth/Throat: Uvula swelling present. No oropharyngeal exudate or tonsillar abscesses.  Uvula enlarged with increase redness. Airway patent. Nasal congestion present.  Eyes: Pupils are equal, round, and reactive to light. EOM and lids are normal.  Neck: Normal range of motion. Neck supple. Carotid bruit is not present.  Cardiovascular: Normal rate, regular rhythm, normal heart sounds, intact distal pulses and normal pulses.  Pulmonary/Chest: Breath sounds normal. No respiratory distress.    Abdominal: Soft. Bowel sounds are normal. There is no tenderness. There is no guarding.  Musculoskeletal: Normal range of motion.  Lymphadenopathy:       Head (right side): No submandibular adenopathy present.       Head (left side): No submandibular adenopathy present.    She has no cervical adenopathy.  Neurological: She is alert and oriented to person, place, and time. She has normal strength. No cranial nerve deficit or sensory deficit.  Skin: Skin is warm and dry.  Psychiatric: She has a normal mood and affect. Her speech is normal.  Nursing note and vitals reviewed.    ED Treatments / Results  Labs (all labs ordered are listed, but only abnormal results are displayed) Labs Reviewed - No data to display  EKG None  Radiology No results found.  Procedures Procedures (including critical care time)  Medications Ordered in ED Medications - No data to display   Initial Impression / Assessment and Plan / ED Course  I have reviewed the triage vital signs and the nursing notes.  Pertinent labs & imaging results that were available during my care of the patient were reviewed by me and considered in my medical decision making (see chart for details).       Final Clinical Impressions(s) / ED Diagnoses MDM Blood pressure is elevated at 161/90, otherwise vital signs within normal limits.  The pulse oximetry is 98% on room air.  Within normal limits by my interpretation.  The patient has increased redness of the posterior pharynx along with increased redness and swelling of the uvula.  The airway is patent.  The patient will be treated with clindamycin and ibuprofen.  I have asked her to use salt water gargles and Chloraseptic spray.  I provided a mask for the patient.  Have asked her to follow-up with her primary physician Dr. Ledell PeoplesMuse or return to the emergency department if any changes in condition, problems, or concerns.   Final diagnoses:  Pharyngitis, unspecified etiology     ED Discharge Orders         Ordered    clindamycin (CLEOCIN) 150 MG capsule  Every 6 hours     01/18/18 1610    ibuprofen (ADVIL,MOTRIN) 600 MG tablet  4 times daily     01/18/18 1610           Ivery QualeBryant, Jahon Bart, PA-C 01/18/18 1617    Benjiman CorePickering, Nathan, MD 01/18/18 2358

## 2018-01-18 NOTE — ED Triage Notes (Signed)
Sore throat, feels like a lump in her throat when swallowing

## 2018-02-10 ENCOUNTER — Encounter (HOSPITAL_COMMUNITY): Payer: Self-pay

## 2018-02-10 ENCOUNTER — Emergency Department (HOSPITAL_COMMUNITY)
Admission: EM | Admit: 2018-02-10 | Discharge: 2018-02-10 | Disposition: A | Discharge status: Home / Self Care | Payer: Self-pay | Attending: Emergency Medicine | Admitting: Emergency Medicine

## 2018-02-10 DIAGNOSIS — Z79899 Other long term (current) drug therapy: Secondary | ICD-10-CM | POA: Insufficient documentation

## 2018-02-10 DIAGNOSIS — H6690 Otitis media, unspecified, unspecified ear: Secondary | ICD-10-CM

## 2018-02-10 DIAGNOSIS — I10 Essential (primary) hypertension: Secondary | ICD-10-CM | POA: Insufficient documentation

## 2018-02-10 DIAGNOSIS — H6691 Otitis media, unspecified, right ear: Secondary | ICD-10-CM | POA: Insufficient documentation

## 2018-02-10 DIAGNOSIS — Z7984 Long term (current) use of oral hypoglycemic drugs: Secondary | ICD-10-CM | POA: Insufficient documentation

## 2018-02-10 DIAGNOSIS — E119 Type 2 diabetes mellitus without complications: Secondary | ICD-10-CM | POA: Insufficient documentation

## 2018-02-10 MED ORDER — AZITHROMYCIN 250 MG PO TABS: ORAL_TABLET | ORAL | 0 refills | Status: AC

## 2018-02-10 NOTE — Discharge Instructions (Addendum)
Take the antibiotic as directed until its finished.  You may continue using the over-the-counter eardrops as needed.  Tylenol or ibuprofen if needed for pain or fever.  Stop any over-the-counter cough or cold medication.

## 2018-02-10 NOTE — ED Notes (Signed)
Pt reports ear aches on and off all of her life  R earache for the last 3-4 days   Also reports when she drinks water within minutes her mouth is dry again

## 2018-02-10 NOTE — ED Triage Notes (Signed)
Pt reports ear pain and pressure since Friday. Decreased hearing . Reports nasal congestion, dry mouth and sore throat

## 2018-02-11 NOTE — ED Provider Notes (Signed)
[facility] EMERGENCY DEPARTMENT Provider Note   CSN: [id number] Arrival date & time: 02/10/18  1108     History   Chief Complaint Chief Complaint  Patient presents with  . Otalgia    HPI Erica Elliott is a 27 y.o. female.  HPI  Erica Elliott is a 27 y.o. female who presents to the Emergency Department complaining of right ear pain, sore throat and dry mouth.  Symptoms present for 2 days.  Describes decreased hearing, but denies dizziness, headache.  No fever or cough. Has been taking OTC cold medication w/o relief.      Past Medical History:  Diagnosis Date  . Diabetes mellitus without complication (HCC)   . Hypercholesteremia   . Hypertension   . Obesity   . Seasonal allergies   . Thyroid disease     There are no active problems to display for this patient.   Past Surgical History:  Procedure Laterality Date  . TONSILLECTOMY       OB History   None      Home Medications    Prior to Admission medications   Medication Sig Start Date End Date Taking? Authorizing Provider  azithromycin (ZITHROMAX) 250 MG tablet Take first 2 tablets together, then 1 every day until finished. 02/10/18   [employer], PA-C  Chlorpheniramine Maleate (ALLERGY PO) Take 2 tablets by mouth every 4 (four) hours as needed (allergies).    [provider]  ibuprofen (ADVIL,MOTRIN) 600 MG tablet Take 1 tablet (600 mg total) by mouth 4 (four) times daily. 01/18/18   Ivery Quale, PA-C  levothyroxine (SYNTHROID, LEVOTHROID) 100 MCG tablet Take 100 mcg by mouth daily.    [provider]  lisinopril (PRINIVIL,ZESTRIL) 20 MG tablet Take 20 mg by mouth daily.    [provider]  loratadine (CLARITIN) 10 MG tablet Take 10 mg by mouth daily.    [provider]  metFORMIN (GLUCOPHAGE) 500 MG tablet Take 500 mg by mouth 2 (two) times daily with a meal.    [provider]    Family History No family history on file.  Social History Social  History   Tobacco Use  . Smoking status: Never Smoker  . Smokeless tobacco: Never Used  Substance Use Topics  . Alcohol use: No  . Drug use: No     Allergies   Penicillins   Review of Systems Review of Systems  Constitutional: Negative for activity change, appetite change, chills and fever.  HENT: Positive for congestion, ear pain and sore throat. Negative for facial swelling, rhinorrhea and trouble swallowing.   Eyes: Negative for visual disturbance.  Respiratory: Negative for cough, shortness of breath, wheezing and stridor.   Gastrointestinal: Negative for nausea and vomiting.  Musculoskeletal: Negative for arthralgias, neck pain and neck stiffness.  Skin: Negative for rash.  Neurological: Negative for dizziness, weakness, numbness and headaches.  Hematological: Negative for adenopathy.  Psychiatric/Behavioral: Negative for confusion.     Physical Exam Updated Vital Signs BP (!) 119/94 (BP Location: Left Arm)   Pulse 88   Temp 97.6 F (36.4 C) (Oral)   Resp 16   Ht 5\' 4"  (1.626 m)   Wt 113.4 kg   LMP 01/27/2018   SpO2 99%   BMI 42.91 kg/m   Physical Exam  Constitutional: She appears well-developed and well-nourished. No distress.  HENT:  Head: Normocephalic and atraumatic.  Mouth/Throat: Uvula is midline, oropharynx is clear and moist and mucous membranes are normal. No uvula swelling. No oropharyngeal  exudate.  Erythema of the right TM.  Mild middle ear effusion present.  No drainage or edema of the ear canal, TM appears intact.    Neck: Normal range of motion, full passive range of motion without pain and phonation normal. Neck supple. No Kernig's sign noted.  Cardiovascular: Normal rate, regular rhythm and intact distal pulses.  No murmur heard. Pulmonary/Chest: Effort normal and breath sounds normal. No stridor. No respiratory distress.  Musculoskeletal: Normal range of motion.  Lymphadenopathy:    She has no cervical adenopathy.  Neurological: She is  alert. No sensory deficit.  Skin: Skin is warm and dry. No rash noted.  Nursing note and vitals reviewed.    ED Treatments / Results  Labs (all labs ordered are listed, but only abnormal results are displayed) Labs Reviewed - No data to display  EKG None  Radiology No results found.  Procedures Procedures (including critical care time)  Medications Ordered in ED Medications - No data to display   Initial Impression / Assessment and Plan / ED Course  I have reviewed the triage vital signs and the nursing notes.  Pertinent labs & imaging results that were available during my care of the patient were reviewed by me and considered in my medical decision making (see chart for details).     Pt non toxic appearing.  Vitals stable.  OM present. Pt appropriate for d/c.  Return precautions discussed.   Final Clinical Impressions(s) / ED Diagnoses   Final diagnoses:  Acute otitis media, unspecified otitis media type    ED Discharge Orders         Ordered    azithromycin (ZITHROMAX) 250 MG tablet     02/10/18 1306           Rosina Cressler, Babette Relic, PA-C 02/11/18 2114    Samuel Jester, DO 02/12/18 1012

## 2018-04-20 ENCOUNTER — Other Ambulatory Visit: Payer: Self-pay

## 2018-04-20 ENCOUNTER — Emergency Department (HOSPITAL_COMMUNITY)
Admission: EM | Admit: 2018-04-20 | Discharge: 2018-04-20 | Disposition: A | Discharge status: Home / Self Care | Payer: Self-pay | Attending: Emergency Medicine | Admitting: Emergency Medicine

## 2018-04-20 DIAGNOSIS — Z79899 Other long term (current) drug therapy: Secondary | ICD-10-CM | POA: Insufficient documentation

## 2018-04-20 DIAGNOSIS — Z7984 Long term (current) use of oral hypoglycemic drugs: Secondary | ICD-10-CM | POA: Insufficient documentation

## 2018-04-20 DIAGNOSIS — I10 Essential (primary) hypertension: Secondary | ICD-10-CM | POA: Insufficient documentation

## 2018-04-20 DIAGNOSIS — Z9114 Patient's other noncompliance with medication regimen: Secondary | ICD-10-CM | POA: Insufficient documentation

## 2018-04-20 DIAGNOSIS — R739 Hyperglycemia, unspecified: Secondary | ICD-10-CM

## 2018-04-20 DIAGNOSIS — E1165 Type 2 diabetes mellitus with hyperglycemia: Secondary | ICD-10-CM | POA: Insufficient documentation

## 2018-04-20 LAB — BASIC METABOLIC PANEL
ANION GAP: 7 (ref 5–15)
BUN: 11 mg/dL (ref 6–20)
CHLORIDE: 99 mmol/L (ref 98–111)
CO2: 27 mmol/L (ref 22–32)
Calcium: 8.7 mg/dL — ABNORMAL LOW (ref 8.9–10.3)
Creatinine, Ser: 0.64 mg/dL (ref 0.44–1.00)
GFR calc non Af Amer: >60 mL/min (ref >60)
Glucose, Bld: 433 mg/dL — ABNORMAL HIGH (ref 70–99)
Potassium: 4.4 mmol/L (ref 3.5–5.1)
Sodium: 133 mmol/L — ABNORMAL LOW (ref 135–145)

## 2018-04-20 LAB — CBC WITH DIFFERENTIAL/PLATELET
Abs Immature Granulocytes: 0.03 10*3/uL (ref 0.00–0.07)
BASOS ABS: 0 10*3/uL (ref 0.0–0.1)
Basophils Relative: 0 %
EOS PCT: 3 %
Eosinophils Absolute: 0.3 10*3/uL (ref 0.0–0.5)
HEMATOCRIT: 41 % (ref 36.0–46.0)
HEMOGLOBIN: 12.9 g/dL (ref 12.0–15.0)
IMMATURE GRANULOCYTES: 0 %
LYMPHS ABS: 2.1 10*3/uL (ref 0.7–4.0)
LYMPHS PCT: 24 %
MCH: 27.4 pg (ref 26.0–34.0)
MCHC: 31.5 g/dL (ref 30.0–36.0)
MCV: 87.2 fL (ref 80.0–100.0)
Monocytes Absolute: 0.5 10*3/uL (ref 0.1–1.0)
Monocytes Relative: 5 %
NRBC: 0 % (ref 0.0–0.2)
Neutro Abs: 5.8 10*3/uL (ref 1.7–7.7)
Neutrophils Relative %: 68 %
Platelets: 203 10*3/uL (ref 150–400)
RBC: 4.7 MIL/uL (ref 3.87–5.11)
RDW: 13.5 % (ref 11.5–15.5)
WBC: 8.7 10*3/uL (ref 4.0–10.5)

## 2018-04-20 LAB — URINALYSIS, ROUTINE W REFLEX MICROSCOPIC
BILIRUBIN URINE: NEGATIVE
Bacteria, UA: NONE SEEN
HGB URINE DIPSTICK: NEGATIVE
KETONES UR: NEGATIVE mg/dL
LEUKOCYTES UA: NEGATIVE
NITRITE: NEGATIVE
Protein, ur: NEGATIVE mg/dL
Specific Gravity, Urine: 1.033 — ABNORMAL HIGH (ref 1.005–1.030)
pH: 5 (ref 5.0–8.0)

## 2018-04-20 LAB — CBG MONITORING, ED
Glucose-Capillary: 429 mg/dL — ABNORMAL HIGH (ref 70–99)
Glucose-Capillary: 331 mg/dL — ABNORMAL HIGH (ref 70–99)

## 2018-04-20 LAB — PREGNANCY, URINE: Preg Test, Ur: NEGATIVE

## 2018-04-20 MED ORDER — METFORMIN HCL 500 MG PO TABS
ORAL_TABLET | ORAL | Status: AC
  Filled 2018-04-20: qty 2

## 2018-04-20 MED ORDER — SODIUM CHLORIDE 0.9 % IV BOLUS
1000.0000 mL | Freq: Once | INTRAVENOUS | Status: AC
  Administered 2018-04-20: 1000 mL via INTRAVENOUS

## 2018-04-20 MED ORDER — METFORMIN HCL 500 MG PO TABS
1000.0000 mg | ORAL_TABLET | Freq: Once | ORAL | Status: AC
  Administered 2018-04-20: 1000 mg via ORAL
  Filled 2018-04-20: qty 2

## 2018-04-20 MED ORDER — METFORMIN HCL 1000 MG PO TABS: 1000.0000 mg | ORAL_TABLET | Freq: Two times a day (BID) | ORAL | 1 refills | Status: AC

## 2018-04-20 NOTE — ED Triage Notes (Signed)
Pt reports she started feeling bad on Sunday with nausea and dizziness.  Was put on Metformin a year ago and ran out a few months ago.  EMS found pt to be hyperglycemic.

## 2018-04-20 NOTE — ED Provider Notes (Signed)
[facility] EMERGENCY DEPARTMENT Provider Note   CSN: [id number] Arrival date & time: 04/20/18  1416     History   Chief Complaint Chief Complaint  Patient presents with  . Hyperglycemia    HPI Erica Elliott is a 27 y.o. female.  HPI   She presents for evaluation of dizziness present for several days associated with mild nausea and occasional cough.  She stopped taking her metformin about 3 months ago, because she did not have a refill on her prescription.  She has urinary frequency, without dysuria or hematuria.  She denies fever, chills, shortness of breath, focal weakness or paresthesia.  There are no other known modifying factors.  Past Medical History:  Diagnosis Date  . Diabetes mellitus without complication (HCC)   . Hypercholesteremia   . Hypertension   . Obesity   . Seasonal allergies   . Thyroid disease     There are no active problems to display for this patient.   Past Surgical History:  Procedure Laterality Date  . TONSILLECTOMY       OB History   None      Home Medications    Prior to Admission medications   Medication Sig Start Date End Date Taking? Authorizing Provider  azithromycin (ZITHROMAX) 250 MG tablet Take first 2 tablets together, then 1 every day until finished. 02/10/18   Triplett, Tammy, PA-C  ibuprofen (ADVIL,MOTRIN) 600 MG tablet Take 1 tablet (600 mg total) by mouth 4 (four) times daily. Patient taking differently: Take 600 mg by mouth every 4 (four) hours as needed for headache.  01/18/18   Ivery Quale, PA-C  levothyroxine (SYNTHROID, LEVOTHROID) 100 MCG tablet Take 100 mcg by mouth daily. Not taking because haven't been to the doctor to get a refill.    [provider]  lisinopril (PRINIVIL,ZESTRIL) 20 MG tablet Take 20 mg by mouth daily.    [provider]  loratadine (CLARITIN) 10 MG tablet Take 10 mg by mouth daily.    [provider]  metFORMIN (GLUCOPHAGE) 1000 MG tablet Take 1 tablet (1,000  mg total) by mouth 2 (two) times daily. 04/20/18   Mancel Bale, MD    Family History No family history on file.  Social History Social History   Tobacco Use  . Smoking status: Never Smoker  . Smokeless tobacco: Never Used  Substance Use Topics  . Alcohol use: No  . Drug use: No     Allergies   Penicillins   Review of Systems Review of Systems  All other systems reviewed and are negative.    Physical Exam Updated Vital Signs BP 121/79   Pulse 82   Temp 98.4 F (36.9 C) (Oral)   Resp 18   Ht 5\' 4"  (1.626 m)   Wt 113.4 kg   LMP 04/04/2018   SpO2 97%   BMI 42.91 kg/m   Physical Exam  Constitutional: She is oriented to person, place, and time. She appears well-developed. No distress.  Morbidly obese  HENT:  Head: Normocephalic and atraumatic.  Eyes: Pupils are equal, round, and reactive to light. Conjunctivae and EOM are normal.  Neck: Normal range of motion and phonation normal. Neck supple.  Cardiovascular: Normal rate and regular rhythm.  Pulmonary/Chest: Effort normal and breath sounds normal. She exhibits no tenderness.  Abdominal: Soft. She exhibits no distension. There is no tenderness. There is no guarding.  Genitourinary:  Genitourinary Comments: No costovertebral angle tenderness with percussion  Musculoskeletal: Normal range of motion.  Neurological: She  is alert and oriented to person, place, and time. She exhibits normal muscle tone.  Skin: Skin is warm and dry.  Psychiatric: She has a normal mood and affect. Her behavior is normal. Judgment and thought content normal.  Nursing note and vitals reviewed.    ED Treatments / Results  Labs (all labs ordered are listed, but only abnormal results are displayed) Labs Reviewed  BASIC METABOLIC PANEL - Abnormal; Notable for the following components:      Result Value   Sodium 133 (*)    Glucose, Bld 433 (*)    Calcium 8.7 (*)    All other components within normal limits  URINALYSIS, ROUTINE W  REFLEX MICROSCOPIC - Abnormal; Notable for the following components:   Color, Urine STRAW (*)    Specific Gravity, Urine 1.033 (*)    Glucose, UA >=500 (*)    All other components within normal limits  CBG MONITORING, ED - Abnormal; Notable for the following components:   Glucose-Capillary 429 (*)    All other components within normal limits  CBC WITH DIFFERENTIAL/PLATELET  PREGNANCY, URINE    EKG None  Radiology No results found.  Procedures .Critical Care Performed by: Mancel Bale, MD Authorized by: Mancel Bale, MD   Critical care provider statement:    Critical care time (minutes):  35   Critical care start time:  04/20/2018 2:40 PM   Critical care end time:  04/20/2018 3:50 PM   Critical care time was exclusive of:  Separately billable procedures and treating other patients   Critical care was necessary to treat or prevent imminent or life-threatening deterioration of the following conditions:  Metabolic crisis   Critical care was time spent personally by me on the following activities:  Blood draw for specimens, development of treatment plan with patient or surrogate, discussions with consultants, evaluation of patient's response to treatment, examination of patient, obtaining history from patient or surrogate, ordering and performing treatments and interventions, ordering and review of laboratory studies, pulse oximetry, re-evaluation of patient's condition, review of old charts and ordering and review of radiographic studies   (including critical care time)  Medications Ordered in ED Medications  sodium chloride 0.9 % bolus 1,000 mL (1,000 mLs Intravenous New Bag/Given 04/20/18 1432)  metFORMIN (GLUCOPHAGE) tablet 1,000 mg (1,000 mg Oral Given 04/20/18 1543)     Initial Impression / Assessment and Plan / ED Course  I have reviewed the triage vital signs and the nursing notes.  Pertinent labs & imaging results that were available during my care of the patient  were reviewed by me and considered in my medical decision making (see chart for details).  Clinical Course as of Apr 21 1543  Sat Apr 20, 2018  1540 Normal  CBC with Differential [EW]  1540 High  CBG monitoring, ED(!) [EW]  1540 Normal except sodium slightly low, glucose elevated, calcium low.  Reassuring normal anion gap.  Basic metabolic panel(!) [EW]  1544 Normal except mild elevation of specific gravity and glucose  Urinalysis, Routine w reflex microscopic(!) [EW]    Clinical Course User Index [EW] Mancel Bale, MD     Patient Vitals for the past 24 hrs:  BP Temp Temp src Pulse Resp SpO2 Height Weight  04/20/18 1430 121/79 - - 82 - 97 % - -  04/20/18 1420 129/83 98.4 F (36.9 C) Oral 85 18 97 % - -  04/20/18 1418 - - - - - - 5\' 4"  (1.626 m) 113.4 kg    3:41 PM  Reevaluation with update and discussion. After initial assessment and treatment, an updated evaluation reveals she remains comfortable has no additional complaints.  Urinalysis pending.  Findings and plan discussed with patient, all questions answered. Mancel BaleElliott Addis Tuohy   Medical Decision Making: Elevation consistent with hyperglycemia and dehydration secondary to noncompliance with medical treatment for diabetes.  Doubt serious bacterial infection, metabolic instability or impending vascular collapse.  CRITICAL CARE-yes Performed by: Mancel BaleElliott Jeiden Daughtridge  Nursing Notes Reviewed/ Care Coordinated Applicable Imaging Reviewed Interpretation of Laboratory Data incorporated into ED treatment  The patient appears reasonably screened and/or stabilized for discharge and I doubt any other medical condition or other [facility] requiring further screening, evaluation, or treatment in the ED at this time prior to discharge.  Plan: Home Medications-resume metformin; Home Treatments-low-carb diet, plenty of fluids; return here if the recommended treatment, does not improve the symptoms; Recommended follow up-PCP checkup 1 to 2 weeks and as  needed.   Final Clinical Impressions(s) / ED Diagnoses   Final diagnoses:  Hyperglycemia  Noncompliance with medication regimen    ED Discharge Orders         Ordered    metFORMIN (GLUCOPHAGE) 1000 MG tablet  2 times daily     04/20/18 1505           Mancel BaleWentz, Keymiah Lyles, MD 04/21/18 1147

## 2018-04-20 NOTE — Discharge Instructions (Signed)
Make sure that you are taking her metformin as prescribed.  A prescription was sent to your pharmacy.  Stay on a low carbohydrate diet and drink plenty of water to improve your blood sugar.  Call your PCP for follow-up appointment as soon as possible.

## 2018-04-20 NOTE — ED Provider Notes (Signed)
  Physical Exam  BP 121/79   Pulse 82   Temp 98.4 F (36.9 C) (Oral)   Resp 18   Ht 5\' 4"  (1.626 m)   Wt 113.4 kg   LMP 04/04/2018   SpO2 97%   BMI 42.91 kg/m   Physical Exam  ED Course/Procedures   Clinical Course as of Apr 20 1624  Sat Apr 20, 2018  1540 Normal  CBC with Differential [EW]  1540 High  CBG monitoring, ED(!) [EW]  1540 Normal except sodium slightly low, glucose elevated, calcium low.  Reassuring normal anion gap.  Basic metabolic panel(!) [EW]  1544 Normal except mild elevation of specific gravity and glucose  Urinalysis, Routine w reflex microscopic(!) [EW]    Clinical Course User Index [EW] Mancel BaleWentz, Elliott, MD    Procedures  MDM  CBG improved and not pregnant.  Discharge home per      Benjiman CorePickering, Jozelyn Kuwahara, MD 04/20/18 1625

## 2018-05-03 ENCOUNTER — Emergency Department (HOSPITAL_COMMUNITY)
Admission: EM | Admit: 2018-05-03 | Discharge: 2018-05-03 | Disposition: A | Discharge status: Home / Self Care | Payer: Self-pay | Attending: Emergency Medicine | Admitting: Emergency Medicine

## 2018-05-03 ENCOUNTER — Encounter (HOSPITAL_COMMUNITY): Payer: Self-pay | Admitting: Emergency Medicine

## 2018-05-03 ENCOUNTER — Other Ambulatory Visit: Payer: Self-pay

## 2018-05-03 DIAGNOSIS — I10 Essential (primary) hypertension: Secondary | ICD-10-CM | POA: Insufficient documentation

## 2018-05-03 DIAGNOSIS — E119 Type 2 diabetes mellitus without complications: Secondary | ICD-10-CM | POA: Insufficient documentation

## 2018-05-03 DIAGNOSIS — B354 Tinea corporis: Secondary | ICD-10-CM | POA: Insufficient documentation

## 2018-05-03 DIAGNOSIS — Z7984 Long term (current) use of oral hypoglycemic drugs: Secondary | ICD-10-CM | POA: Insufficient documentation

## 2018-05-03 DIAGNOSIS — E079 Disorder of thyroid, unspecified: Secondary | ICD-10-CM | POA: Insufficient documentation

## 2018-05-03 DIAGNOSIS — Z79899 Other long term (current) drug therapy: Secondary | ICD-10-CM | POA: Insufficient documentation

## 2018-05-03 LAB — CBC WITH DIFFERENTIAL/PLATELET
Abs Immature Granulocytes: 0.02 10*3/uL (ref 0.00–0.07)
BASOS ABS: 0 10*3/uL (ref 0.0–0.1)
BASOS PCT: 1 %
Eosinophils Absolute: 0.2 10*3/uL (ref 0.0–0.5)
Eosinophils Relative: 3 %
HCT: 43.7 % (ref 36.0–46.0)
Hemoglobin: 13.6 g/dL (ref 12.0–15.0)
Immature Granulocytes: 0 %
Lymphocytes Relative: 29 %
Lymphs Abs: 2.5 10*3/uL (ref 0.7–4.0)
MCH: 27 pg (ref 26.0–34.0)
MCHC: 31.1 g/dL (ref 30.0–36.0)
MCV: 86.7 fL (ref 80.0–100.0)
MONO ABS: 0.4 10*3/uL (ref 0.1–1.0)
MONOS PCT: 5 %
Neutro Abs: 5.5 10*3/uL (ref 1.7–7.7)
Neutrophils Relative %: 62 %
Platelets: 238 10*3/uL (ref 150–400)
RBC: 5.04 MIL/uL (ref 3.87–5.11)
RDW: 13 % (ref 11.5–15.5)
WBC: 8.7 10*3/uL (ref 4.0–10.5)
nRBC: 0 % (ref 0.0–0.2)

## 2018-05-03 LAB — URINALYSIS, ROUTINE W REFLEX MICROSCOPIC
BACTERIA UA: NONE SEEN
BILIRUBIN URINE: NEGATIVE
Glucose, UA: >=500 mg/dL — AB
Hgb urine dipstick: NEGATIVE
KETONES UR: NEGATIVE mg/dL
Nitrite: NEGATIVE
PH: 5 (ref 5.0–8.0)
Protein, ur: NEGATIVE mg/dL
Specific Gravity, Urine: 1.027 (ref 1.005–1.030)

## 2018-05-03 LAB — PROTIME-INR
INR: 1.1
PROTHROMBIN TIME: 14.1 s (ref 11.4–15.2)

## 2018-05-03 LAB — PREGNANCY, URINE: PREG TEST UR: NEGATIVE

## 2018-05-03 LAB — BASIC METABOLIC PANEL
ANION GAP: 9 (ref 5–15)
BUN: 8 mg/dL (ref 6–20)
CALCIUM: 8.5 mg/dL — AB (ref 8.9–10.3)
CO2: 23 mmol/L (ref 22–32)
Chloride: 102 mmol/L (ref 98–111)
Creatinine, Ser: 0.57 mg/dL (ref 0.44–1.00)
GFR calc Af Amer: >60 mL/min (ref >60)
GLUCOSE: 242 mg/dL — AB (ref 70–99)
POTASSIUM: 3.9 mmol/L (ref 3.5–5.1)
Sodium: 134 mmol/L — ABNORMAL LOW (ref 135–145)

## 2018-05-03 NOTE — ED Triage Notes (Signed)
Notice rash to right buttock 2 days ago.  C/o redness and pain rating pain 3/10.

## 2018-05-03 NOTE — Discharge Instructions (Signed)
Please apply clotrimazole or Lotrimin to the skin 3 times daily for the next 2 weeks - this will gradually get better - it is contagious to others - continue the Metformin - have your doctor recheck in you 2 weeks.  These medicines are over the counter.

## 2018-05-03 NOTE — ED Provider Notes (Signed)
[facility] EMERGENCY DEPARTMENT Provider Note   CSN: [id number] Arrival date & time: 05/03/18  1117     History   Chief Complaint Chief Complaint  Patient presents with  . Rash    HPI Erica Elliott is a 27 y.o. female.  HPI  The patient is a 27 year old female, she has a known history of type 2 diabetes for which she has been noncompliant, recently seen in the emergency department approximately 2 weeks ago during which time she was identified as being significantly hyperglycemic and requiring some intervention including fluids and treatment.  She was represcribed her metformin and has restarted on that medication approximately 4 days ago.  She has noted that in the last couple of days she has developed a rash to her bilateral buttocks more on the right side than the left, it is itchy and slightly tender.  She has no other symptoms, the rash is nowhere else, it is not on the abdomen chest arms legs face or inside the mouth.  She has had slight diarrhea secondary to the metformin.  She denies any other symptoms.  He has tried a topical antibiotic ointment but states it did not help and today she had slightly worsening rash and discomfort.  Past Medical History:  Diagnosis Date  . Diabetes mellitus without complication ([place])   . Hypercholesteremia   . Hypertension   . Obesity   . Seasonal allergies   . Thyroid disease     There are no active problems to display for this patient.   Past Surgical History:  Procedure Laterality Date  . TONSILLECTOMY       OB History   None      Home Medications    Prior to Admission medications   Medication Sig Start Date End Date Taking? Authorizing Provider  azithromycin (ZITHROMAX) 250 MG tablet Take first 2 tablets together, then 1 every day until finished. 02/10/18   Triplett, Tammy, PA-C  ibuprofen (ADVIL,MOTRIN) 600 MG tablet Take 1 tablet (600 mg total) by mouth 4 (four) times daily. Patient taking differently: Take 600 mg  by mouth every 4 (four) hours as needed for headache.  01/18/18   Lily Kocher, PA-C  levothyroxine (SYNTHROID, LEVOTHROID) 100 MCG tablet Take 100 mcg by mouth daily. Not taking because haven't been to the doctor to get a refill.    [provider]  lisinopril (PRINIVIL,ZESTRIL) 20 MG tablet Take 20 mg by mouth daily.    [provider]  loratadine (CLARITIN) 10 MG tablet Take 10 mg by mouth daily.    [provider]  metFORMIN (GLUCOPHAGE) 1000 MG tablet Take 1 tablet (1,000 mg total) by mouth 2 (two) times daily. 04/20/18   Daleen Bo, MD    Family History History reviewed. No pertinent family history.  Social History Social History   Tobacco Use  . Smoking status: Never Smoker  . Smokeless tobacco: Never Used  Substance Use Topics  . Alcohol use: No  . Drug use: No     Allergies   Penicillins   Review of Systems Review of Systems  Constitutional: Negative for fever.  Respiratory: Negative for cough and shortness of breath.   Cardiovascular: Negative for chest pain and leg swelling.  Genitourinary: Negative for dysuria and frequency.  Musculoskeletal: Negative for joint swelling, myalgias and neck pain.  Skin: Positive for rash.     Physical Exam Updated Vital Signs BP 127/72 (BP Location: Right Arm)   Pulse 75   Temp 97.6 F (36.4  C) (Oral)   Resp 16   Ht 1.626 m (5\' 4" )   Wt 113.4 kg   LMP 04/04/2018   SpO2 99%   BMI 42.91 kg/m   Physical Exam  Constitutional: She appears well-developed and well-nourished. No distress.  HENT:  Head: Normocephalic and atraumatic.  Eyes: Conjunctivae are normal. Right eye exhibits no discharge. Left eye exhibits no discharge. No scleral icterus.  Cardiovascular: Intact distal pulses.  Pulmonary/Chest: Effort normal.  Neurological: She is alert. Coordination normal.  Skin: Skin is warm and dry. Rash noted. There is erythema.  The patient has what appears to be an erythematous non-indurated  nontender nonraised macular rash with a dry border, there is no central clearing, there are multiple defined lesions on the right buttock, a couple on the left side, none of these are tender indurated or masslike.  There is no vesicles, no pustules, this is not purpuric, these lesions do blanch.  Nursing note and vitals reviewed.    ED Treatments / Results  Labs (all labs ordered are listed, but only abnormal results are displayed) Labs Reviewed  BASIC METABOLIC PANEL - Abnormal; Notable for the following components:      Result Value   Sodium 134 (*)    Glucose, Bld 242 (*)    Calcium 8.5 (*)    All other components within normal limits  URINALYSIS, ROUTINE W REFLEX MICROSCOPIC - Abnormal; Notable for the following components:   Glucose, UA >=500 (*)    Leukocytes, UA TRACE (*)    All other components within normal limits  CBC WITH DIFFERENTIAL/PLATELET  PROTIME-INR  PREGNANCY, URINE    EKG None  Radiology No results found.  Procedures Procedures (including critical care time)  Medications Ordered in ED Medications - No data to display   Initial Impression / Assessment and Plan / ED Course  I have reviewed the triage vital signs and the nursing notes.  Pertinent labs & imaging results that were available during my care of the patient were reviewed by me and considered in my medical decision making (see chart for details).     This does appear to be a blanching erythematous scaling lesion which could be consistent with a number of etiologies including erythema multiforme (less likely), would also consider tinea corporis, consider pityriasis rosea, less likely to be purpura.  Check labs given recent change in medications make sure she is not hypoglycemic, will also likely start topical medication such as an antifungal, patient agreeable to the plan, she has non-ill appearing  Final Clinical Impressions(s) / ED Diagnoses   Final diagnoses:  Tinea corporis        Noemi Chapel, MD 05/03/18 1333
